# Patient Record
Sex: Male | Born: 1985 | Race: White | Hispanic: No | Marital: Single | Smoking: Former smoker
Health system: Southern US, Community
[De-identification: ages and names within clinical notes are randomized; demographics above are authoritative.]

## PROBLEM LIST (undated history)

## (undated) DIAGNOSIS — R519 Headache, unspecified: Secondary | ICD-10-CM

## (undated) DIAGNOSIS — Z789 Other specified health status: Secondary | ICD-10-CM

## (undated) DIAGNOSIS — K219 Gastro-esophageal reflux disease without esophagitis: Secondary | ICD-10-CM

## (undated) HISTORY — PX: KNEE SURGERY: SHX244

## (undated) HISTORY — PX: TONSILLECTOMY: SUR1361

---

## 2004-05-20 ENCOUNTER — Ambulatory Visit: Payer: Self-pay | Admitting: Unknown Physician Specialty

## 2017-01-26 ENCOUNTER — Ambulatory Visit
Admission: EM | Admit: 2017-01-26 | Discharge: 2017-01-26 | Disposition: A | Discharge status: Home / Self Care | Payer: Self-pay | Attending: Family Medicine | Admitting: Family Medicine

## 2017-01-26 ENCOUNTER — Other Ambulatory Visit: Payer: Self-pay

## 2017-01-26 ENCOUNTER — Encounter: Payer: Self-pay | Admitting: Emergency Medicine

## 2017-01-26 DIAGNOSIS — H1031 Unspecified acute conjunctivitis, right eye: Secondary | ICD-10-CM

## 2017-01-26 DIAGNOSIS — H109 Unspecified conjunctivitis: Secondary | ICD-10-CM

## 2017-01-26 DIAGNOSIS — H00011 Hordeolum externum right upper eyelid: Secondary | ICD-10-CM

## 2017-01-26 HISTORY — DX: Other specified health status: Z78.9

## 2017-01-26 MED ORDER — OLOPATADINE HCL 0.2 % OP SOLN: OPHTHALMIC | 0 refills | Status: DC

## 2017-01-26 MED ORDER — POLYMYXIN B-TRIMETHOPRIM 10000-0.1 UNIT/ML-% OP SOLN: 1.0000 [drp] | Freq: Four times a day (QID) | OPHTHALMIC | 0 refills | Status: AC

## 2017-01-26 NOTE — Discharge Instructions (Signed)
Eye drops as prescribed.  Warm compresses frequently.  Take care  Dr. Adriana Simasook

## 2017-01-26 NOTE — ED Provider Notes (Signed)
[facility]   CSN: [id number] Arrival date & time: 01/26/17  1125  History   Chief Complaint Chief Complaint  Patient presents with  . Conjunctivitis   HPI  32 year old male presents with the above complaint.  Patient reports a 1-2-week history of bilateral eye redness and watering.  Reports associated itching.  Recently, the right eye has improved but the left eye has worsened.  He reports he has had some crusting.  He is now developed eyelid edema of the left eye, particularly the upper lid.  He has been using over-the-counter eyedrops and Neosporin without resolution.  Vision intact.  He does report mild pain.  No known inciting factor.  No known exacerbating factors.  No other complaints at this time.  Past Medical History:  Diagnosis Date  . No known health problems    Past Surgical History:  Procedure Laterality Date  . KNEE SURGERY Right    Home Medications    Prior to Admission medications   Medication Sig Start Date End Date Taking? Authorizing Provider  Olopatadine HCl 0.2 % SOLN 1 drop to each eye daily. 01/26/17   Tommie Samsook, Aaden Buckman G, DO  trimethoprim-polymyxin b (POLYTRIM) ophthalmic solution Place 1 drop into both eyes every 6 (six) hours for 5 days. 01/26/17 01/31/17  Tommie Samsook, Kenson Groh G, DO   Family History Family History  Problem Relation Age of Onset  . Healthy Mother   . Healthy Father    Social History Social History   Tobacco Use  . Smoking status: Former Smoker    Last attempt to quit: 01/26/2005    Years since quitting: 12.0  . Smokeless tobacco: Never Used  Substance Use Topics  . Alcohol use: Yes    Alcohol/week: 1.8 oz    Types: 3 Cans of beer per week  . Drug use: No    Allergies   Patient has no known allergies.   Review of Systems Review of Systems  Constitutional: Negative for fever.  Eyes: Positive for discharge, redness and itching. Negative for photophobia and visual disturbance.   Physical Exam Triage Vital Signs ED Triage  Vitals [01/26/17 1149]  Enc Vitals Group     BP 130/74     Pulse Rate 60     Resp 16     Temp 97.8 F (36.6 C)     Temp Source Oral     SpO2 100 %     Weight 150 lb (68 kg)     Height 5\' 9"  (1.753 m)     Head Circumference      Peak Flow      Pain Score 0     Pain Loc      Pain Edu?      Excl. in GC?    Updated Vital Signs BP 130/74 (BP Location: Left Arm)   Pulse 60   Temp 97.8 F (36.6 C) (Oral)   Resp 16   Ht 5\' 9"  (1.753 m)   Wt 150 lb (68 kg)   SpO2 100%   BMI 22.15 kg/m   Visual Acuity Right Eye Distance: 20/25 Left Eye Distance: 20/25 Bilateral Distance: 20/20  Right Eye Near:   Left Eye Near:    Bilateral Near:     Physical Exam  Constitutional: He is oriented to person, place, and time. He appears well-developed and well-nourished. No distress.  HENT:  Head: Normocephalic and atraumatic.  Nose: Nose normal.  Eyes: EOM are normal.  Right eye -normal conjunctiva.  No drainage.  Left  eye -mild conjunctival injection.  Upper lid edema, diffuse.  Pulmonary/Chest: Effort normal. No respiratory distress.  Neurological: He is alert and oriented to person, place, and time.  Psychiatric: He has a normal mood and affect. His behavior is normal.  Nursing note and vitals reviewed.  UC Treatments / Results  Labs (all labs ordered are listed, but only abnormal results are displayed) Labs Reviewed - No data to display  EKG  EKG Interpretation None       Radiology No results found.  Procedures Procedures (including critical care time)  Medications Ordered in UC Medications - No data to display   Initial Impression / Assessment and Plan / UC Course  I have reviewed the triage vital signs and the nursing notes.  Pertinent labs & imaging results that were available during my care of the patient were reviewed by me and considered in my medical decision making (see chart for details).     32 year old male presents with ongoing conjunctivitis.  Also  with eyelid edema concerning for hordeolum.  Treating with Polytrim and Pataday.  Final Clinical Impressions(s) / UC Diagnoses   Final diagnoses:  Conjunctivitis of right eye, unspecified conjunctivitis type  Hordeolum externum of right upper eyelid    ED Discharge Orders        Ordered    trimethoprim-polymyxin b (POLYTRIM) ophthalmic solution  Every 6 hours     01/26/17 1202    Olopatadine HCl 0.2 % SOLN     01/26/17 1202     Controlled Substance Prescriptions [place] Controlled Substance Registry consulted? Not Applicable   Tommie Sams, DO 01/26/17 1212

## 2017-01-26 NOTE — ED Triage Notes (Signed)
Patient in today c/o 1-2 week history of conjunctivitis. Patient has been using OTC stye ointment, "pink eye drops" from [facility] and Neosporin to left eye lid.

## 2017-01-29 ENCOUNTER — Telehealth: Payer: Self-pay | Admitting: Emergency Medicine

## 2017-01-29 NOTE — Telephone Encounter (Signed)
Tried to call patient to follow up from his visit on 01/26/17. Phone number listed is not in service.

## 2019-11-19 ENCOUNTER — Emergency Department
Admission: EM | Admit: 2019-11-19 | Discharge: 2019-11-19 | Disposition: A | Discharge status: Home / Self Care | Payer: BC Managed Care – PPO | Attending: Emergency Medicine | Admitting: Emergency Medicine

## 2019-11-19 ENCOUNTER — Emergency Department: Payer: BC Managed Care – PPO

## 2019-11-19 ENCOUNTER — Other Ambulatory Visit: Payer: Self-pay

## 2019-11-19 ENCOUNTER — Encounter: Payer: Self-pay | Admitting: Emergency Medicine

## 2019-11-19 DIAGNOSIS — W010XXA Fall on same level from slipping, tripping and stumbling without subsequent striking against object, initial encounter: Secondary | ICD-10-CM | POA: Diagnosis not present

## 2019-11-19 DIAGNOSIS — Y9353 Activity, golf: Secondary | ICD-10-CM | POA: Insufficient documentation

## 2019-11-19 DIAGNOSIS — S8261XA Displaced fracture of lateral malleolus of right fibula, initial encounter for closed fracture: Secondary | ICD-10-CM | POA: Diagnosis not present

## 2019-11-19 DIAGNOSIS — Z87891 Personal history of nicotine dependence: Secondary | ICD-10-CM | POA: Insufficient documentation

## 2019-11-19 DIAGNOSIS — S82831A Other fracture of upper and lower end of right fibula, initial encounter for closed fracture: Secondary | ICD-10-CM

## 2019-11-19 DIAGNOSIS — S99911A Unspecified injury of right ankle, initial encounter: Secondary | ICD-10-CM | POA: Diagnosis present

## 2019-11-19 MED ORDER — ONDANSETRON 4 MG PO TBDP: 4.0000 mg | ORAL_TABLET | Freq: Three times a day (TID) | ORAL | 0 refills | Status: AC | PRN

## 2019-11-19 MED ORDER — HYDROCODONE-ACETAMINOPHEN 5-325 MG PO TABS: 1.0000 | ORAL_TABLET | Freq: Four times a day (QID) | ORAL | 0 refills | Status: AC | PRN

## 2019-11-19 NOTE — Discharge Instructions (Signed)
Please make follow up appointment with ortho.  You can take Norco for pain.

## 2019-11-19 NOTE — ED Triage Notes (Signed)
Pt comes into the ED via POV c/o right ankle pain after slipping on the tee while golfing.  Pt in NAD.

## 2019-11-19 NOTE — ED Provider Notes (Signed)
Emergency Department Provider Note  ____________________________________________  Time seen: Approximately 7:43 PM  I have reviewed the triage vital signs and the nursing notes.   HISTORY  Chief Complaint Ankle Pain   Historian Patient    HPI Brian Delgado is a 34 y.o. male presents to the emergency department with acute right ankle pain after slipping on a tree while golfing.  Patient did not hit his head or his neck.  No numbness or tingling of the lower extremities.  Patient has had difficulty bearing weight since injury occurred.  He states that he has had prior right ankle sprains in the past.  No other alleviating measures of been attempted.   Past Medical History:  Diagnosis Date   No known health problems      Immunizations up to date:  Yes.     Past Medical History:  Diagnosis Date   No known health problems     There are no problems to display for this patient.   Past Surgical History:  Procedure Laterality Date   KNEE SURGERY Right     Prior to Admission medications   Medication Sig Start Date End Date Taking? Authorizing Provider  HYDROcodone-acetaminophen (NORCO/VICODIN) 5-325 MG tablet Take 1 tablet by mouth every 6 (six) hours as needed for up to 3 days for moderate pain. 11/19/19 11/22/19  Orvil Feil, PA-C  Olopatadine HCl 0.2 % SOLN 1 drop to each eye daily. 01/26/17   Tommie Sams, DO  ondansetron (ZOFRAN ODT) 4 MG disintegrating tablet Take 1 tablet (4 mg total) by mouth every 8 (eight) hours as needed for up to 5 days. 11/19/19 11/24/19  Orvil Feil, PA-C    Allergies Patient has no known allergies.  Family History  Problem Relation Age of Onset   Healthy Mother    Healthy Father     Social History Social History   Tobacco Use   Smoking status: Former Smoker    Quit date: 01/26/2005    Years since quitting: 14.8   Smokeless tobacco: Never Used  Vaping Use   Vaping Use: Never used  Substance Use Topics   Alcohol  use: Yes    Alcohol/week: 3.0 standard drinks    Types: 3 Cans of beer per week   Drug use: No     Review of Systems  Constitutional: No fever/chills Eyes:  No discharge ENT: No upper respiratory complaints. Respiratory: no cough. No SOB/ use of accessory muscles to breath Gastrointestinal:   No nausea, no vomiting.  No diarrhea.  No constipation. Musculoskeletal: Patient has right ankle pain.  Skin: Negative for rash, abrasions, lacerations, ecchymosis.    ____________________________________________   PHYSICAL EXAM:  VITAL SIGNS: ED Triage Vitals  Enc Vitals Group     BP 11/19/19 1757 (!) 170/103     Pulse Rate 11/19/19 1757 75     Resp 11/19/19 1757 18     Temp 11/19/19 1757 97.8 F (36.6 C)     Temp Source 11/19/19 1757 Oral     SpO2 11/19/19 1757 99 %     Weight 11/19/19 1759 160 lb (72.6 kg)     Height 11/19/19 1759 5\' 10"  (1.778 m)     Head Circumference --      Peak Flow --      Pain Score 11/19/19 1759 8     Pain Loc --      Pain Edu? --      Excl. in GC? --  Constitutional: Alert and oriented. Well appearing and in no acute distress. Eyes: Conjunctivae are normal. PERRL. EOMI. Head: Atraumatic. Cardiovascular: Normal rate, regular rhythm. Normal S1 and S2.  Good peripheral circulation. Respiratory: Normal respiratory effort without tachypnea or retractions. Lungs CTAB. Good air entry to the bases with no decreased or absent breath sounds Gastrointestinal: Bowel sounds x 4 quadrants. Soft and nontender to palpation. No guarding or rigidity. No distention. Musculoskeletal: Patient performs limited range of motion at the right ankle.  He has tenderness to palpation over the lateral malleolus.  He is able to move all five right toes.  Palpable dorsalis pedis pulse bilaterally and symmetrically.  Capillary refill less than 2 seconds on the right. Neurologic:  Normal for age. No gross focal neurologic deficits are appreciated.  Skin:  Skin is warm, dry and  intact. No rash noted. Psychiatric: Mood and affect are normal for age. Speech and behavior are normal.   ____________________________________________   LABS (all labs ordered are listed, but only abnormal results are displayed)  Labs Reviewed - No data to display ____________________________________________  EKG   ____________________________________________  RADIOLOGY Geraldo Pitter, personally viewed and evaluated these images (plain radiographs) as part of my medical decision making, as well as reviewing the written report by the radiologist.  DG Ankle Complete Right  Result Date: 11/19/2019 CLINICAL DATA:  Right ankle pain after slipping on tee while golfing. EXAM: RIGHT ANKLE - COMPLETE 3+ VIEW COMPARISON:  None. FINDINGS: Transverse/oblique fracture of the lateral malleolus is minimally displaced at the level of the ankle mortise. No distal tibial fracture. There is no mortise widening. Small ankle joint effusion. There is lateral soft tissue edema. IMPRESSION: Minimally displaced lateral malleolar fracture. Associated soft tissue edema and small joint effusion. Electronically Signed   By: Narda Rutherford M.D.   On: 11/19/2019 18:35    ____________________________________________    PROCEDURES  Procedure(s) performed:     Procedures     Medications - No data to display   ____________________________________________   INITIAL IMPRESSION / ASSESSMENT AND PLAN / ED COURSE  Pertinent labs & imaging results that were available during my care of the patient were reviewed by me and considered in my medical decision making (see chart for details).      Assessment and plan Right ankle pain 34 year old male presents to the emergency department with acute right ankle pain after slipping on a tea while golfing.  X-rays of the right ankle were visualized and patient has a mildly displaced right lateral malleolar fracture.  Patient was placed in a cam boot and was  discharged with a short course of Norco.  Crutches were provided.  He was advised to follow-up with orthopedics, Dr. Odis Luster.  All patient questions were answered.    ____________________________________________  FINAL CLINICAL IMPRESSION(S) / ED DIAGNOSES  Final diagnoses:  Closed fracture of distal end of right fibula, unspecified fracture morphology, initial encounter      NEW MEDICATIONS STARTED DURING THIS VISIT:  ED Discharge Orders         Ordered    HYDROcodone-acetaminophen (NORCO/VICODIN) 5-325 MG tablet  Every 6 hours PRN        11/19/19 1939    ondansetron (ZOFRAN ODT) 4 MG disintegrating tablet  Every 8 hours PRN        11/19/19 1939              This chart was dictated using voice recognition software/Dragon. Despite best efforts to proofread, errors can occur which  can change the meaning. Any change was purely unintentional.     Orvil Feil, PA-C 11/19/19 2208    Gilles Chiquito, MD 11/19/19 2219

## 2019-11-25 ENCOUNTER — Other Ambulatory Visit: Payer: Self-pay | Admitting: Podiatry

## 2019-11-26 ENCOUNTER — Other Ambulatory Visit: Payer: Self-pay

## 2019-11-26 ENCOUNTER — Encounter
Admission: RE | Admit: 2019-11-26 | Discharge: 2019-11-26 | Disposition: A | Discharge status: Home / Self Care | Payer: BC Managed Care – PPO | Source: Ambulatory Visit | Attending: Podiatry | Admitting: Podiatry

## 2019-11-26 DIAGNOSIS — Z01812 Encounter for preprocedural laboratory examination: Secondary | ICD-10-CM | POA: Insufficient documentation

## 2019-11-26 DIAGNOSIS — Z20822 Contact with and (suspected) exposure to covid-19: Secondary | ICD-10-CM | POA: Diagnosis not present

## 2019-11-26 DIAGNOSIS — S82831A Other fracture of upper and lower end of right fibula, initial encounter for closed fracture: Secondary | ICD-10-CM | POA: Diagnosis present

## 2019-11-26 DIAGNOSIS — X58XXXA Exposure to other specified factors, initial encounter: Secondary | ICD-10-CM | POA: Diagnosis not present

## 2019-11-26 DIAGNOSIS — K219 Gastro-esophageal reflux disease without esophagitis: Secondary | ICD-10-CM | POA: Diagnosis not present

## 2019-11-26 DIAGNOSIS — Z87891 Personal history of nicotine dependence: Secondary | ICD-10-CM | POA: Diagnosis not present

## 2019-11-26 DIAGNOSIS — Z803 Family history of malignant neoplasm of breast: Secondary | ICD-10-CM | POA: Diagnosis not present

## 2019-11-26 DIAGNOSIS — Y939 Activity, unspecified: Secondary | ICD-10-CM | POA: Diagnosis not present

## 2019-11-26 DIAGNOSIS — Z8249 Family history of ischemic heart disease and other diseases of the circulatory system: Secondary | ICD-10-CM | POA: Diagnosis not present

## 2019-11-26 DIAGNOSIS — Z820 Family history of epilepsy and other diseases of the nervous system: Secondary | ICD-10-CM | POA: Diagnosis not present

## 2019-11-26 HISTORY — DX: Gastro-esophageal reflux disease without esophagitis: K21.9

## 2019-11-26 HISTORY — DX: Headache, unspecified: R51.9

## 2019-11-26 NOTE — Patient Instructions (Signed)
Your procedure is scheduled on: Friday 11/28/19.  Report to THE FIRST FLOOR REGISTRATION DESK IN THE MEDICAL MALL ON THE MORNING OF SURGERY FIRST, THEN YOU WILL CHECK IN AT THE SURGERY INFORMATION DESK LOCATED OUTSIDE THE SAME DAY SURGERY DEPARTMENT LOCATED ON 2ND FLOOR MEDICAL MALL ENTRANCE. To find out your arrival time please call [phone] between 1PM - 3PM on Thursday 11/27/19.   Remember: Instructions that are not followed completely may result in serious medical risk, up to and including death, or upon the discretion of your surgeon and anesthesiologist your surgery may need to be rescheduled.     __X__ 1. Do not eat food after midnight the night before your procedure.                 No gum chewing or hard candies. You may drink clear liquids up to 2 hours                 before you are scheduled to arrive for your surgery- DO NOT drink clear                 liquids within 2 hours of the start of your surgery.                 Clear Liquids include:  water, apple juice without pulp, clear carbohydrate                 drink such as Clearfast or Gatorade, Black Coffee or Tea (Do not add                 milk or creamer to coffee or tea).  __X__2.  On the morning of surgery brush your teeth with toothpaste and water, you may rinse your mouth with mouthwash if you wish.  Do not swallow any toothpaste or mouthwash.    __X__ 3.  No Alcohol for 24 hours before or after surgery.  __X__ 4.  Do Not Smoke or use e-cigarettes For 24 Hours Prior to Your Surgery.                 Do not use any chewable tobacco products for at least 6 hours prior to                 surgery.  __X__5.  Notify your doctor if there is any change in your medical condition      (cold, fever, infections).      Do NOT wear jewelry, make-up, hairpins, clips or nail polish. Do NOT wear lotions, powders, or perfumes.  Do NOT shave 48 hours prior to surgery. Men may shave face and neck. Do NOT bring valuables to the  hospital.     [facility] is not responsible for any belongings or valuables.   Contacts, dentures/partials or body piercings may not be worn into surgery. Bring a case for your contacts, glasses or hearing aids, a denture cup will be supplied.    Patients discharged the day of surgery will not be allowed to drive home.     __X__ Take these medicines the morning of surgery with A SIP OF WATER:     1. acetaminophen (TYLENOL) if needed    __X__ Use CHG SAGE wipes as directed.  __X__ Stop Anti-inflammatories 7 days before surgery such as Advil, Ibuprofen, Motrin, BC or Goodies Powder, Naprosyn, Naproxen, Aleve, Aspirin, Meloxicam. May take Tylenol if needed for pain or discomfort.   __X__Do not start taking any new herbal supplements  or vitamins prior to your procedure.    Wear comfortable clothing (specific to your surgery type) to the hospital.  Plan for stool softeners for home use; pain medications have a tendency to cause constipation. You can also help prevent constipation by eating foods high in fiber such as fruits and vegetables and drinking plenty of fluids as your diet allows.  After surgery, you can prevent lung complications by doing breathing exercises.Take deep breaths and cough every 1-2 hours. Your doctor may order a device called an Incentive Spirometer to help you take deep breaths.  Please call the Pre-Admissions Testing Department at [phone] if you have any questions about these instructions.

## 2019-11-27 ENCOUNTER — Other Ambulatory Visit
Admission: RE | Admit: 2019-11-27 | Discharge: 2019-11-27 | Disposition: A | Discharge status: Home / Self Care | Payer: BC Managed Care – PPO | Source: Ambulatory Visit | Attending: Podiatry | Admitting: Podiatry

## 2019-11-27 DIAGNOSIS — S82831A Other fracture of upper and lower end of right fibula, initial encounter for closed fracture: Secondary | ICD-10-CM | POA: Diagnosis not present

## 2019-11-27 DIAGNOSIS — Z01812 Encounter for preprocedural laboratory examination: Secondary | ICD-10-CM | POA: Insufficient documentation

## 2019-11-27 DIAGNOSIS — Z20822 Contact with and (suspected) exposure to covid-19: Secondary | ICD-10-CM | POA: Insufficient documentation

## 2019-11-28 ENCOUNTER — Encounter: Payer: Self-pay | Admitting: Podiatry

## 2019-11-28 ENCOUNTER — Ambulatory Visit: Payer: BC Managed Care – PPO

## 2019-11-28 ENCOUNTER — Ambulatory Visit: Payer: BC Managed Care – PPO | Admitting: Anesthesiology

## 2019-11-28 ENCOUNTER — Other Ambulatory Visit: Payer: Self-pay

## 2019-11-28 ENCOUNTER — Encounter
Admission: RE | Disposition: A | Discharge status: Home / Self Care | Payer: Self-pay | Source: Home / Self Care | Attending: Podiatry

## 2019-11-28 ENCOUNTER — Ambulatory Visit
Admission: RE | Admit: 2019-11-28 | Discharge: 2019-11-28 | Disposition: A | Discharge status: Home / Self Care | Payer: BC Managed Care – PPO | Attending: Podiatry | Admitting: Podiatry

## 2019-11-28 DIAGNOSIS — Y939 Activity, unspecified: Secondary | ICD-10-CM | POA: Insufficient documentation

## 2019-11-28 DIAGNOSIS — Z20822 Contact with and (suspected) exposure to covid-19: Secondary | ICD-10-CM | POA: Insufficient documentation

## 2019-11-28 DIAGNOSIS — X58XXXA Exposure to other specified factors, initial encounter: Secondary | ICD-10-CM | POA: Insufficient documentation

## 2019-11-28 DIAGNOSIS — Z419 Encounter for procedure for purposes other than remedying health state, unspecified: Secondary | ICD-10-CM

## 2019-11-28 DIAGNOSIS — K219 Gastro-esophageal reflux disease without esophagitis: Secondary | ICD-10-CM | POA: Insufficient documentation

## 2019-11-28 DIAGNOSIS — Z8249 Family history of ischemic heart disease and other diseases of the circulatory system: Secondary | ICD-10-CM | POA: Insufficient documentation

## 2019-11-28 DIAGNOSIS — Z820 Family history of epilepsy and other diseases of the nervous system: Secondary | ICD-10-CM | POA: Insufficient documentation

## 2019-11-28 DIAGNOSIS — S82831A Other fracture of upper and lower end of right fibula, initial encounter for closed fracture: Secondary | ICD-10-CM | POA: Diagnosis not present

## 2019-11-28 DIAGNOSIS — Z87891 Personal history of nicotine dependence: Secondary | ICD-10-CM | POA: Insufficient documentation

## 2019-11-28 DIAGNOSIS — Z803 Family history of malignant neoplasm of breast: Secondary | ICD-10-CM | POA: Insufficient documentation

## 2019-11-28 HISTORY — PX: ORIF ANKLE FRACTURE: SHX5408

## 2019-11-28 LAB — SARS CORONAVIRUS 2 (TAT 6-24 HRS): SARS Coronavirus 2: NEGATIVE

## 2019-11-28 SURGERY — OPEN REDUCTION INTERNAL FIXATION (ORIF) ANKLE FRACTURE
Anesthesia: General | Site: Ankle | Laterality: Right

## 2019-11-28 MED ORDER — ONDANSETRON HCL 4 MG PO TABS: 4.0000 mg | ORAL_TABLET | Freq: Four times a day (QID) | ORAL | Status: DC | PRN

## 2019-11-28 MED ORDER — FENTANYL CITRATE (PF) 100 MCG/2ML IJ SOLN
INTRAMUSCULAR | Status: AC
  Filled 2019-11-28: qty 2

## 2019-11-28 MED ORDER — FAMOTIDINE 20 MG PO TABS
ORAL_TABLET | ORAL | Status: AC
  Administered 2019-11-28: 20 mg via ORAL
  Filled 2019-11-28: qty 1

## 2019-11-28 MED ORDER — POVIDONE-IODINE 10 % EX SWAB
2.0000 "application " | Freq: Once | CUTANEOUS | Status: AC
  Administered 2019-11-28: 2 via TOPICAL

## 2019-11-28 MED ORDER — MIDAZOLAM HCL 2 MG/2ML IJ SOLN
INTRAMUSCULAR | Status: AC
  Administered 2019-11-28: 1 mg via INTRAVENOUS
  Filled 2019-11-28: qty 2

## 2019-11-28 MED ORDER — DEXAMETHASONE SODIUM PHOSPHATE 10 MG/ML IJ SOLN
INTRAMUSCULAR | Status: DC | PRN
  Administered 2019-11-28: 10 mg via INTRAVENOUS

## 2019-11-28 MED ORDER — LACTATED RINGERS IV SOLN: INTRAVENOUS | Status: DC

## 2019-11-28 MED ORDER — LIDOCAINE HCL (PF) 1 % IJ SOLN
INTRAMUSCULAR | Status: AC
  Filled 2019-11-28: qty 5

## 2019-11-28 MED ORDER — ROPIVACAINE HCL 5 MG/ML IJ SOLN
INTRAMUSCULAR | Status: DC | PRN
  Administered 2019-11-28: 30 mL via PERINEURAL

## 2019-11-28 MED ORDER — PROPOFOL 10 MG/ML IV BOLUS
INTRAVENOUS | Status: DC | PRN
  Administered 2019-11-28: 200 mg via INTRAVENOUS
  Administered 2019-11-28: 150 mg via INTRAVENOUS

## 2019-11-28 MED ORDER — ROPIVACAINE HCL 5 MG/ML IJ SOLN
INTRAMUSCULAR | Status: AC
  Filled 2019-11-28: qty 30

## 2019-11-28 MED ORDER — FENTANYL CITRATE (PF) 100 MCG/2ML IJ SOLN: 50.0000 ug | Freq: Once | INTRAMUSCULAR | Status: AC

## 2019-11-28 MED ORDER — MIDAZOLAM HCL 2 MG/2ML IJ SOLN
INTRAMUSCULAR | Status: AC
  Filled 2019-11-28: qty 2

## 2019-11-28 MED ORDER — FAMOTIDINE 20 MG PO TABS: 20.0000 mg | ORAL_TABLET | Freq: Once | ORAL | Status: AC

## 2019-11-28 MED ORDER — BUPIVACAINE HCL (PF) 0.25 % IJ SOLN
INTRAMUSCULAR | Status: DC | PRN
  Administered 2019-11-28: 20 mL

## 2019-11-28 MED ORDER — CHLORHEXIDINE GLUCONATE 0.12 % MT SOLN
OROMUCOSAL | Status: AC
  Administered 2019-11-28: 15 mL via OROMUCOSAL
  Filled 2019-11-28: qty 15

## 2019-11-28 MED ORDER — MIDAZOLAM HCL 2 MG/2ML IJ SOLN: 1.0000 mg | Freq: Once | INTRAMUSCULAR | Status: AC

## 2019-11-28 MED ORDER — ONDANSETRON HCL 4 MG/2ML IJ SOLN
INTRAMUSCULAR | Status: DC | PRN
  Administered 2019-11-28: 4 mg via INTRAVENOUS

## 2019-11-28 MED ORDER — FENTANYL CITRATE (PF) 100 MCG/2ML IJ SOLN
INTRAMUSCULAR | Status: AC
  Administered 2019-11-28: 50 ug via INTRAVENOUS
  Filled 2019-11-28: qty 2

## 2019-11-28 MED ORDER — LIDOCAINE HCL (CARDIAC) PF 100 MG/5ML IV SOSY
PREFILLED_SYRINGE | INTRAVENOUS | Status: DC | PRN
  Administered 2019-11-28: 100 mg via INTRAVENOUS

## 2019-11-28 MED ORDER — MIDAZOLAM HCL 2 MG/2ML IJ SOLN
0.5000 mg | Freq: Once | INTRAMUSCULAR | Status: AC
  Administered 2019-11-28: 0.5 mg via INTRAVENOUS

## 2019-11-28 MED ORDER — OXYCODONE-ACETAMINOPHEN 5-325 MG PO TABS
1.0000 | ORAL_TABLET | ORAL | 0 refills | Status: AC | PRN
Start: 2019-11-28 — End: 2020-11-27

## 2019-11-28 MED ORDER — CEFAZOLIN SODIUM-DEXTROSE 2-4 GM/100ML-% IV SOLN
INTRAVENOUS | Status: AC
  Filled 2019-11-28: qty 100

## 2019-11-28 MED ORDER — FENTANYL CITRATE (PF) 100 MCG/2ML IJ SOLN
INTRAMUSCULAR | Status: DC | PRN
  Administered 2019-11-28: 50 ug via INTRAVENOUS

## 2019-11-28 MED ORDER — CEFAZOLIN SODIUM-DEXTROSE 2-4 GM/100ML-% IV SOLN
2.0000 g | INTRAVENOUS | Status: AC
  Administered 2019-11-28: 2 g via INTRAVENOUS

## 2019-11-28 MED ORDER — LIDOCAINE HCL (PF) 1 % IJ SOLN
INTRAMUSCULAR | Status: DC | PRN
  Administered 2019-11-28: 5 mL via SUBCUTANEOUS

## 2019-11-28 MED ORDER — FENTANYL CITRATE (PF) 100 MCG/2ML IJ SOLN: 25.0000 ug | INTRAMUSCULAR | Status: DC | PRN

## 2019-11-28 MED ORDER — ONDANSETRON HCL 4 MG/2ML IJ SOLN: 4.0000 mg | Freq: Four times a day (QID) | INTRAMUSCULAR | Status: DC | PRN

## 2019-11-28 MED ORDER — PROPOFOL 10 MG/ML IV BOLUS
INTRAVENOUS | Status: AC
  Filled 2019-11-28: qty 20

## 2019-11-28 MED ORDER — CHLORHEXIDINE GLUCONATE 0.12 % MT SOLN: 15.0000 mL | Freq: Once | OROMUCOSAL | Status: AC

## 2019-11-28 MED ORDER — PROMETHAZINE HCL 25 MG/ML IJ SOLN: 6.2500 mg | INTRAMUSCULAR | Status: DC | PRN

## 2019-11-28 MED ORDER — ORAL CARE MOUTH RINSE: 15.0000 mL | Freq: Once | OROMUCOSAL | Status: AC

## 2019-11-28 SURGICAL SUPPLY — 56 items
BIT DRILL 2.5X2.75 QC CALB (BIT) ×1 IMPLANT
BIT DRILL CALIBRATED 2.7 (BIT) ×1 IMPLANT
BLADE SURG 15 STRL LF DISP TIS (BLADE) IMPLANT
BLADE SURG 15 STRL SS (BLADE)
BNDG CMPR STD VLCR NS LF 5.8X4 (GAUZE/BANDAGES/DRESSINGS) ×1
BNDG COHESIVE 4X5 TAN STRL (GAUZE/BANDAGES/DRESSINGS) ×2 IMPLANT
BNDG CONFORM 2 STRL LF (GAUZE/BANDAGES/DRESSINGS) ×2 IMPLANT
BNDG CONFORM 3 STRL LF (GAUZE/BANDAGES/DRESSINGS) ×2 IMPLANT
BNDG ELASTIC 4X5.8 VLCR NS LF (GAUZE/BANDAGES/DRESSINGS) ×2 IMPLANT
BNDG ESMARK 4X12 TAN STRL LF (GAUZE/BANDAGES/DRESSINGS) ×2 IMPLANT
BNDG GAUZE 4.5X4.1 6PLY STRL (MISCELLANEOUS) ×2 IMPLANT
BOOT STEPPER DURA MED (SOFTGOODS) ×1 IMPLANT
CANISTER SUCT 1200ML W/VALVE (MISCELLANEOUS) ×2 IMPLANT
COVER WAND RF STERILE (DRAPES) ×2 IMPLANT
CUFF TOURN SGL QUICK 18X4 (TOURNIQUET CUFF) IMPLANT
CUFF TOURN SGL QUICK 24 (TOURNIQUET CUFF)
CUFF TRNQT CYL 24X4X16.5-23 (TOURNIQUET CUFF) IMPLANT
DRAPE C-ARM XRAY 36X54 (DRAPES) ×2 IMPLANT
DRAPE C-ARMOR (DRAPES) ×2 IMPLANT
DURAPREP 26ML APPLICATOR (WOUND CARE) ×2 IMPLANT
ELECT REM PT RETURN 9FT ADLT (ELECTROSURGICAL) ×2
ELECTRODE REM PT RTRN 9FT ADLT (ELECTROSURGICAL) ×1 IMPLANT
GAUZE SPONGE 4X4 12PLY STRL (GAUZE/BANDAGES/DRESSINGS) ×2 IMPLANT
GAUZE XEROFORM 1X8 LF (GAUZE/BANDAGES/DRESSINGS) ×2 IMPLANT
GLOVE BIO SURGEON STRL SZ7.5 (GLOVE) ×2 IMPLANT
GLOVE INDICATOR 8.0 STRL GRN (GLOVE) ×2 IMPLANT
GOWN STRL REUS W/ TWL XL LVL3 (GOWN DISPOSABLE) ×3 IMPLANT
GOWN STRL REUS W/TWL XL LVL3 (GOWN DISPOSABLE) ×6
K-WIRE ACE 1.6X6 (WIRE) ×2
KIT TURNOVER KIT A (KITS) ×2 IMPLANT
KWIRE ACE 1.6X6 (WIRE) IMPLANT
LABEL OR SOLS (LABEL) ×2 IMPLANT
MANIFOLD NEPTUNE II (INSTRUMENTS) ×2 IMPLANT
NEEDLE HYPO 22GX1.5 SAFETY (NEEDLE) ×2 IMPLANT
NS IRRIG 500ML POUR BTL (IV SOLUTION) ×2 IMPLANT
PACK EXTREMITY (MISCELLANEOUS) ×2 IMPLANT
PAD PREP 24X41 OB/GYN DISP (PERSONAL CARE ITEMS) ×2 IMPLANT
PLATE LOCK 6H 77 BILAT FIB (Plate) ×1 IMPLANT
SCREW LOCK CORT STAR 3.5X10 (Screw) ×2 IMPLANT
SCREW LOCK CORT STAR 3.5X12 (Screw) ×4 IMPLANT
SPLINT CAST 1 STEP 4X30 (MISCELLANEOUS) ×2 IMPLANT
SPLINT FAST PLASTER 5X30 (CAST SUPPLIES) ×1
SPLINT PLASTER CAST FAST 5X30 (CAST SUPPLIES) ×1 IMPLANT
SPONGE LAP 18X18 RF (DISPOSABLE) ×2 IMPLANT
STAPLER SKIN PROX 35W (STAPLE) ×2 IMPLANT
STOCKINETTE M/LG 89821 (MISCELLANEOUS) ×2 IMPLANT
STRAP SAFETY 5IN WIDE (MISCELLANEOUS) ×2 IMPLANT
STRIP CLOSURE SKIN 1/2X4 (GAUZE/BANDAGES/DRESSINGS) IMPLANT
SUT PDS AB 2-0 CT1 27 (SUTURE) ×1 IMPLANT
SUT VIC AB 2-0 CT1 27 (SUTURE) ×2
SUT VIC AB 2-0 CT1 TAPERPNT 27 (SUTURE) ×1 IMPLANT
SUT VIC AB 3-0 SH 27 (SUTURE) ×2
SUT VIC AB 3-0 SH 27X BRD (SUTURE) ×1 IMPLANT
SWABSTK COMLB BENZOIN TINCTURE (MISCELLANEOUS) IMPLANT
SYR 10ML LL (SYRINGE) ×2 IMPLANT
SYR 50ML LL SCALE MARK (SYRINGE) ×2 IMPLANT

## 2019-11-28 NOTE — Anesthesia Procedure Notes (Signed)
Anesthesia Regional Block: Popliteal block   Pre-Anesthetic Checklist: ,, timeout performed, Correct Patient, Correct Site, Correct Laterality, Correct Procedure, Correct Position, site marked, Risks and benefits discussed,  Surgical consent,  Pre-op evaluation,  At surgeon's request and post-op pain management  Laterality: Lower and Right  Prep: chloraprep       Needles:  Injection technique: Single-shot  Needle Type: Echogenic Needle     Needle Length: 9cm  Needle Gauge: 21     Additional Needles:   Procedures:,,,, ultrasound used (permanent image in chart),,,,  Narrative:  Start time: 11/28/2019 12:54 PM End time: 11/28/2019 12:57 PM Injection made incrementally with aspirations every 5 mL.  Performed by: Personally  Anesthesiologist: Lenard Simmer, MD  Additional Notes: Patient consented for risk and benefits of nerve block including but not limited to nerve damage, failed block, bleeding and infection.  Patient voiced understanding.  Functioning IV was confirmed and monitors were applied.  Timeout done prior to procedure and prior to any sedation being given to the patient.  Patient confirmed procedure site prior to any sedation given to the patient.  A 39mm 22ga Stimuplex needle was used. Sterile prep,hand hygiene and sterile gloves were used.  Minimal sedation used for procedure.  No paresthesia endorsed by patient during the procedure.  Negative aspiration and negative test dose prior to incremental administration of local anesthetic. The patient tolerated the procedure well with no immediate complications.

## 2019-11-28 NOTE — Anesthesia Preprocedure Evaluation (Signed)
Anesthesia Evaluation  Patient identified by MRN, date of birth, ID band Patient awake    Reviewed: Allergy & Precautions, H&P , NPO status , Patient's Chart, lab work & pertinent test results, reviewed documented beta blocker date and time   History of Anesthesia Complications Negative for: history of anesthetic complications  Airway Mallampati: I  TM Distance: >3 FB Neck ROM: full    Dental  (+) Dental Advidsory Given, Teeth Intact   Pulmonary neg pulmonary ROS, former smoker,    Pulmonary exam normal breath sounds clear to auscultation       Cardiovascular Exercise Tolerance: Good negative cardio ROS Normal cardiovascular exam Rhythm:regular Rate:Normal     Neuro/Psych negative neurological ROS  negative psych ROS   GI/Hepatic Neg liver ROS, GERD  ,  Endo/Other  negative endocrine ROS  Renal/GU negative Renal ROS  negative genitourinary   Musculoskeletal   Abdominal   Peds  Hematology negative hematology ROS (+)   Anesthesia Other Findings Past Medical History: No date: GERD (gastroesophageal reflux disease) No date: Headache No date: No known health problems   Reproductive/Obstetrics negative OB ROS                             Anesthesia Physical Anesthesia Plan  ASA: II  Anesthesia Plan: General   Post-op Pain Management:  Regional for Post-op pain   Induction: Intravenous  PONV Risk Score and Plan: 2 and Ondansetron, Dexamethasone, Midazolam, Promethazine and Treatment may vary due to age or medical condition  Airway Management Planned: LMA and Oral ETT  Additional Equipment:   Intra-op Plan:   Post-operative Plan: Extubation in OR  Informed Consent: I have reviewed the patients History and Physical, chart, labs and discussed the procedure including the risks, benefits and alternatives for the proposed anesthesia with the patient or authorized representative who  has indicated his/her understanding and acceptance.     Dental Advisory Given  Plan Discussed with: Anesthesiologist, CRNA and Surgeon  Anesthesia Plan Comments:         Anesthesia Quick Evaluation

## 2019-11-28 NOTE — Anesthesia Procedure Notes (Signed)
Procedure Name: LMA Insertion Date/Time: 11/28/2019 1:51 PM Performed by: Henrietta Hoover, CRNA Pre-anesthesia Checklist: Patient identified, Patient being monitored, Timeout performed, Emergency Drugs available and Suction available Patient Re-evaluated:Patient Re-evaluated prior to induction Oxygen Delivery Method: Circle system utilized Preoxygenation: Pre-oxygenation with 100% oxygen Induction Type: IV induction Ventilation: Mask ventilation without difficulty LMA: LMA inserted LMA Size: 4.0 Tube type: Oral Number of attempts: 1 Placement Confirmation: positive ETCO2 and breath sounds checked- equal and bilateral Tube secured with: Tape Dental Injury: Teeth and Oropharynx as per pre-operative assessment

## 2019-11-28 NOTE — Transfer of Care (Signed)
Immediate Anesthesia Transfer of Care Note  Patient: Brian Delgado  Procedure(s) Performed: OPEN REDUCTION INTERNAL FIXATION (ORIF) ANKLE FRACTURE (Right Ankle)  Patient Location: PACU  Anesthesia Type:General  Level of Consciousness: awake, alert  and oriented  Airway & Oxygen Therapy: Patient Spontanous Breathing  Post-op Assessment: Report given to RN and Post -op Vital signs reviewed and stable  Post vital signs: Reviewed and stable  Last Vitals:  Vitals Value Taken Time  BP 120/70 11/28/19 1508  Temp    Pulse 67 11/28/19 1513  Resp 16 11/28/19 1513  SpO2 100 % 11/28/19 1513  Vitals shown include unvalidated device data.  Last Pain:  Vitals:   11/28/19 1131  TempSrc: Temporal  PainSc: 5          Complications: No complications documented.

## 2019-11-28 NOTE — H&P (Addendum)
HISTORY AND PHYSICAL INTERVAL NOTE:  11/28/2019  12:02 PM  Brian Delgado  has presented today for surgery, with the diagnosis of S82.831 CLOSED FRACTURE DISTAL END RIGHT FIBULA and deltoid ligament tear.  The various methods of treatment have been discussed with the patient.  No guarantees were given.  After consideration of risks, benefits and other options for treatment, the patient has consented to surgery.  I have reviewed the patients' chart and labs.     A history and physical examination was performed in my office.  The patient was reexamined.  There have been no changes to this history and physical examination.  Brian Delgado A

## 2019-11-28 NOTE — Discharge Instructions (Signed)
AMBULATORY SURGERY  DISCHARGE INSTRUCTIONS   1) The drugs that you were given will stay in your system until tomorrow so for the next 24 hours you should not:  A) Drive an automobile B) Make any legal decisions C) Drink any alcoholic beverage   2) You may resume regular meals tomorrow.  Today it is better to start with liquids and gradually work up to solid foods.  You may eat anything you prefer, but it is better to start with liquids, then soup and crackers, and gradually work up to solid foods.   3) Please notify your doctor immediately if you have any unusual bleeding, trouble breathing, redness and pain at the surgery site, drainage, fever, or pain not relieved by medication.    4) Additional Instructions:        Please contact your physician with any problems or Same Day Surgery at [phone], Monday through Friday 6 am to 4 pm, or [place] at [facility] number at [phone].[facility]  POST OPERATIVE INSTRUCTIONS FOR DR. TROXLER, DR. Ether Griffins, AND [facility] P[facility]   1. Take your medication as prescribed.  Pain medication should be taken only as needed.  2. Keep the dressing clean, dry and intact.  3. Keep your foot elevated above the heart level for the first 48 hours.  4. We have instructed you to be non-weight bearing.  5. Always wear your post-op shoe when walking.  Always use your crutches if you are to be non-weight bearing.  6. Do not take a shower. Baths are permissible as long as the foot is kept out of the water.   7. Every hour you are awake:  - Bend your knee 15 times.  8. Call [facility] [phone]) if any of the following problems occur: - You develop a temperature or fever. - The bandage becomes saturated with blood. - Medication does not stop your pain. - Injury of the foot occurs. - Any symptoms of infection including redness, odor, or red streaks  running from wound.

## 2019-11-28 NOTE — Op Note (Signed)
Operative note   Surgeon:Shoua Ressler Armed forces logistics/support/administrative officer: None    Preop diagnosis: 1.  Distal fibular fracture right ankle 2.  Deltoid ligament tear right medial ankle    Postop diagnosis: Same    Procedure: 1.  ORIF distal fibular fracture right ankle 2.  Open repair deltoid ligament right medial ankle    EBL: Minimal    Anesthesia:regional and general.  Patient underwent popliteal block in the preop holding area.  Supplemented with 20 cc of 0.25% bupivacaine along the incision sites    Hemostasis: Thigh tourniquet inflated to 250 mmHg for approximately 1 hour.    Specimen: None    Complications: None    Operative indications:Brian Delgado is an 34 y.o. that presents today for surgical intervention.  The risks/benefits/alternatives/complications have been discussed and consent has been given.    Procedure:  Patient was brought into the OR and placed on the operating table in thesupine position. After anesthesia was obtained theright lower extremity was prepped and draped in usual sterile fashion.  Attention was directed to the lateral aspect of the right ankle where a longitudinal incision was performed.  Sharp and blunt dissection was carried down to the periosteum.  Subperiosteal dissection was then performed.  The short oblique distal fracture was noted.  This was displaced.  All soft tissue was removed from the fracture site.  This was then reduced with a bone compression clamp.  A lateral plate was placed.  2 holes distal and 4 holes proximal to the fracture site were filled with 3.5 mm locking screws.  Anatomic alignment was noted in all planes.    Attention was then directed to the medial aspect of the ankle along the deltoid region.  An incision was performed.  Sharp and blunt dissection was carried down to the deep fibers of the deltoid and the ankle joint itself.  The deltoid fibers and ligament was noted to be within the medial gutter.  This was removed from the medial gutter.  At  this time with a 2-0 PDS this was primarily repaired.  The more superficial fibers were repaired with a 3-0 Vicryl.  Anatomic alignment was noted with fluoroscopy in all planes.  At this time closure was then performed with a combination of 2-0 and 3-0 Vicryl for the subcutaneous and deeper tissues and skin staples for the skin.  A bulky sterile dressing was applied.  Patient was then placed at neutral 90 degree angle into a equalizer walker boot.    Patient tolerated the procedure and anesthesia well.  Was transported from the OR to the PACU with all vital signs stable and vascular status intact. To be discharged per routine protocol.  Will follow up in approximately 1 week in the outpatient clinic.  A prescription for Percocet was sent to his pharmacy today.

## 2019-12-01 ENCOUNTER — Encounter: Payer: Self-pay | Admitting: Podiatry

## 2019-12-01 NOTE — Anesthesia Postprocedure Evaluation (Signed)
Anesthesia Post Note  Patient: Brian Delgado  Procedure(s) Performed: OPEN REDUCTION INTERNAL FIXATION (ORIF) ANKLE FRACTURE (Right Ankle)  Patient location during evaluation: PACU Anesthesia Type: General Level of consciousness: awake and alert Pain management: pain level controlled Vital Signs Assessment: post-procedure vital signs reviewed and stable Respiratory status: spontaneous breathing, nonlabored ventilation, respiratory function stable and patient connected to nasal cannula oxygen Cardiovascular status: blood pressure returned to baseline and stable Postop Assessment: no apparent nausea or vomiting Anesthetic complications: no   No complications documented.   Last Vitals:  Vitals:   11/28/19 1538 11/28/19 1600  BP: (!) 147/89 (!) 143/97  Pulse: 74 84  Resp: 15 16  Temp: 36.9 C (!) 36.2 C  SpO2: 99% 99%    Last Pain:  Vitals:   11/28/19 1600  TempSrc: Tympanic  PainSc: 0-No pain                 Lenard Simmer

## 2019-12-22 ENCOUNTER — Ambulatory Visit: Payer: Self-pay | Attending: Internal Medicine

## 2019-12-22 DIAGNOSIS — Z23 Encounter for immunization: Secondary | ICD-10-CM

## 2019-12-22 NOTE — Progress Notes (Signed)
   Covid-19 Vaccination Clinic  Name:  Brian Delgado    MRN: [id number] DOB: September 30, 1985  12/22/2019  [name] was observed post Covid-19 immunization for 15 minutes without incident. He was provided with Vaccine Information Sheet and instruction to access the V-Safe system.   [name] was instructed to call 911 with any severe reactions post vaccine: Marland Kitchen Difficulty breathing  . Swelling of face and throat  . A fast heartbeat  . A bad rash all over body  . Dizziness and weakness   Immunizations Administered    Name Date Dose VIS Date Route   JANSSEN COVID-19 VACCINE 12/22/2019  3:21 PM 0.5 mL 11/05/2019 Intramuscular   Manufacturer: [employer]   Lot: [id number]   NDC: [phone]

## 2020-04-16 ENCOUNTER — Other Ambulatory Visit: Payer: Self-pay

## 2020-04-16 ENCOUNTER — Ambulatory Visit
Admission: EM | Admit: 2020-04-16 | Discharge: 2020-04-16 | Disposition: A | Discharge status: Home / Self Care | Payer: BC Managed Care – PPO | Attending: Sports Medicine | Admitting: Sports Medicine

## 2020-04-16 DIAGNOSIS — J029 Acute pharyngitis, unspecified: Secondary | ICD-10-CM | POA: Diagnosis not present

## 2020-04-16 DIAGNOSIS — K120 Recurrent oral aphthae: Secondary | ICD-10-CM | POA: Insufficient documentation

## 2020-04-16 LAB — GROUP A STREP BY PCR: Group A Strep by PCR: NOT DETECTED

## 2020-04-16 MED ORDER — LIDOCAINE VISCOUS HCL 2 % MT SOLN: 15.0000 mL | Freq: Four times a day (QID) | OROMUCOSAL | 0 refills | Status: AC | PRN

## 2020-04-16 NOTE — ED Provider Notes (Signed)
[facility]    CSN: [id number] Arrival date & time: 04/16/20  0801      History   Chief Complaint Chief Complaint  Patient presents with  . Sore Throat    HPI Brian Delgado is a 35 y.o. male.   35 year old male who presents for evaluation of the above issues.  He works over [employer] and was sent here from work due to his sore throat.  Patient reports having a sore throat now for 3 days.  He says it is progressively getting worse.  He has been using over-the-counter meds and honey and hot drinks but it does not seem to be helping.  He denies any strep exposure.  He has been vaccinated against COVID but no booster.  No COVID history or Covid exposure.  He has not received his flu shot.  No fever shakes chills.  No nausea vomiting diarrhea.  Has no significant past medical history and takes no medications on a regular basis.  He denies any chest pain or shortness of breath.  He does have a little bit of mild ear discomfort.  No red flag signs or symptoms elicited on history.     Past Medical History:  Diagnosis Date  . GERD (gastroesophageal reflux disease)   . Headache   . No known health problems     There are no problems to display for this patient.   Past Surgical History:  Procedure Laterality Date  . KNEE SURGERY Right   . ORIF ANKLE FRACTURE Right 11/28/2019   Procedure: OPEN REDUCTION INTERNAL FIXATION (ORIF) ANKLE FRACTURE;  Surgeon: [name], DPM;  Location: [facility] ORS;  Service: Podiatry;  Laterality: Right;  . TONSILLECTOMY         Home Medications    Prior to Admission medications   Medication Sig Start Date End Date Taking? Authorizing Provider  lidocaine (XYLOCAINE) 2 % solution Use as directed 15 mLs in the mouth or throat every 6 (six) hours as needed for mouth pain. 04/16/20  Yes Delton See, MD  acetaminophen (TYLENOL) 500 MG tablet Take 1,000 mg by mouth every 6 (six) hours as needed for moderate pain.    [provider]   ibuprofen (ADVIL) 200 MG tablet Take 400-600 mg by mouth every 6 (six) hours as needed for moderate pain.    [provider]  oxyCODONE-acetaminophen (PERCOCET) 5-325 MG tablet Take 1 tablet by mouth every 4 (four) hours as needed for severe pain. 11/28/19 11/27/20  [name], DPM    Family History Family History  Problem Relation Age of Onset  . Healthy Mother   . Healthy Father     Social History Social History   Tobacco Use  . Smoking status: Former Smoker    Quit date: 01/26/2005    Years since quitting: 15.2  . Smokeless tobacco: Never Used  Vaping Use  . Vaping Use: Never used  Substance Use Topics  . Alcohol use: Yes    Alcohol/week: 3.0 standard drinks    Types: 3 Cans of beer per week  . Drug use: No     Allergies   Patient has no known allergies.   Review of Systems Review of Systems  Constitutional: Negative for activity change, appetite change, chills, diaphoresis, fatigue and fever.  HENT: Positive for ear pain and sore throat. Negative for congestion, ear discharge, postnasal drip, rhinorrhea, sinus pressure, sinus pain, sneezing and tinnitus.   Eyes: Negative.  Negative for pain.  Respiratory: Negative.  Negative for cough  and chest tightness.   Cardiovascular: Negative.  Negative for chest pain and palpitations.  Gastrointestinal: Negative.  Negative for abdominal pain, constipation, diarrhea, nausea and vomiting.  Genitourinary: Negative.  Negative for dysuria.  Musculoskeletal: Negative.  Negative for back pain and myalgias.  Skin: Negative.  Negative for rash.  Neurological: Negative.  Negative for dizziness, tremors, syncope, light-headedness, numbness and headaches.  All other systems reviewed and are negative.    Physical Exam Triage Vital Signs ED Triage Vitals  Enc Vitals Group     BP 04/16/20 0809 (!) 158/112     Pulse Rate 04/16/20 0809 93     Resp 04/16/20 0809 16     Temp 04/16/20 0809 97.7 F (36.5 C)     Temp  Source 04/16/20 0809 Oral     SpO2 04/16/20 0809 99 %     Weight 04/16/20 0810 150 lb (68 kg)     Height 04/16/20 0810 5\' 8"  (1.727 m)     Head Circumference --      Peak Flow --      Pain Score 04/16/20 0810 7     Pain Loc --      Pain Edu? --      Excl. in GC? --    No data found.  Updated Vital Signs BP (!) 158/112   Pulse 93   Temp 97.7 F (36.5 C) (Oral)   Resp 16   Ht 5\' 8"  (1.727 m)   Wt 68 kg   SpO2 99%   BMI 22.81 kg/m   Visual Acuity Right Eye Distance:   Left Eye Distance:   Bilateral Distance:    Right Eye Near:   Left Eye Near:    Bilateral Near:     Physical Exam Vitals and nursing note reviewed.  Constitutional:      General: He is not in acute distress.    Appearance: He is well-developed. He is not ill-appearing, toxic-appearing or diaphoretic.  HENT:     Head: Normocephalic and atraumatic.     Right Ear: External ear normal. There is impacted cerumen.     Left Ear: Tympanic membrane normal.     Mouth/Throat:     Mouth: Mucous membranes are moist. Oral lesions present.     Pharynx: Uvula midline. Posterior oropharyngeal erythema present. No pharyngeal swelling, oropharyngeal exudate or uvula swelling.     Tonsils: No tonsillar exudate or tonsillar abscesses. 0 on the right. 0 on the left.  Eyes:     Extraocular Movements:     Right eye: Normal extraocular motion.     Left eye: Normal extraocular motion.     Conjunctiva/sclera: Conjunctivae normal.     Pupils: Pupils are equal, round, and reactive to light.  Cardiovascular:     Rate and Rhythm: Normal rate and regular rhythm.     Heart sounds: Normal heart sounds. No murmur heard. No friction rub. No gallop.   Pulmonary:     Effort: Pulmonary effort is normal. No respiratory distress.     Breath sounds: Normal breath sounds. No stridor. No wheezing, rhonchi or rales.  Abdominal:     Palpations: Abdomen is soft.  Musculoskeletal:     Cervical back: Normal range of motion and neck supple.   Lymphadenopathy:     Cervical: No cervical adenopathy.  Skin:    General: Skin is warm and dry.     Capillary Refill: Capillary refill takes less than 2 seconds.     Findings: No erythema.  Neurological:  General: No focal deficit present.     Mental Status: He is alert and oriented to person, place, and time.      UC Treatments / Results  Labs (all labs ordered are listed, but only abnormal results are displayed) Labs Reviewed  GROUP A STREP BY PCR    EKG   Radiology No results found.  Procedures Procedures (including critical care time)  Medications Ordered in UC Medications - No data to display  Initial Impression / Assessment and Plan / UC Course  I have reviewed the triage vital signs and the nursing notes.  Pertinent labs & imaging results that were available during my care of the patient were reviewed by me and considered in my medical decision making (see chart for details).   Clinical impression: Sore throat x3 days.  Patient has some ulcerations on the uvula.  There is no exudate.  Mild erythema.  No other significant symptoms.  Examination and vital signs are reassuring.  Examination is consistent with aphthous ulcerations with several localized shallow round oval ulcers on the uvula.  Treatment plan: 1.  The findings and treatment plan were discussed in detail with the patient.  Patient was in agreement. 2.  Recommended we get a group A strep PCR.  It was negative. 3.  Educational handouts provided. 4.  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 5.  Prescribe viscous lidocaine. 6.  If symptoms persist then he should follow-up with his primary care provider or ENT.  He is also welcome to come back here if his symptoms change in any way.  He voiced verbal understanding. 7.  Follow-up here as needed.    Final Clinical Impressions(s) / UC Diagnoses   Final diagnoses:  Aphthous ulceration  Sore throat     Discharge  Instructions     You have canker sores.  I provided educational handout for you.  The treatment is as follows:  General measures: ?Oral hygiene - It is important to maintain good dental hygiene while at the same time avoiding trauma. A soft toothbrush, waxed tape-style dental floss, and a soft-tipped gum stimulator to gently remove plaque are generally well tolerated. A non-alcohol-containing mouthwash is often less irritating, but still effective, in decreasing microbial overgrowth. Toothpaste containing sodium lauryl sulfate (SLS) may exacerbate RAS in some patients. A trial of using SLS-free toothpaste could be considered. Less aggressive, more frequent professional dental cleaning is advised. ?Avoidance of exacerbating factors - Where possible, reduce traumatic factors inside the mouth (eg, sharp/rough dental restorations, braces). Avoid habits that cause trauma (eg, biting cheeks or lips) and foods that seem to exacerbate the process. ?Pain control - Topical anesthetics and coating agents can provide temporary relief of discomfort if used prior to eating and performing dental hygiene: .2% viscous lidocaine: may be applied directly to surface of ulcers or used as a swish and spit    ED Prescriptions    Medication Sig Dispense Auth. Provider   lidocaine (XYLOCAINE) 2 % solution Use as directed 15 mLs in the mouth or throat every 6 (six) hours as needed for mouth pain. 100 mL Delton See, MD     PDMP not reviewed this encounter.   Delton See, MD 04/16/20 [phone]

## 2020-04-16 NOTE — Discharge Instructions (Addendum)
You have canker sores.  I provided educational handout for you.  The treatment is as follows:  General measures: ?Oral hygiene - It is important to maintain good dental hygiene while at the same time avoiding trauma. A soft toothbrush, waxed tape-style dental floss, and a soft-tipped gum stimulator to gently remove plaque are generally well tolerated. A non-alcohol-containing mouthwash is often less irritating, but still effective, in decreasing microbial overgrowth. Toothpaste containing sodium lauryl sulfate (SLS) may exacerbate RAS in some patients. A trial of using SLS-free toothpaste could be considered. Less aggressive, more frequent professional dental cleaning is advised. ?Avoidance of exacerbating factors - Where possible, reduce traumatic factors inside the mouth (eg, sharp/rough dental restorations, braces). Avoid habits that cause trauma (eg, biting cheeks or lips) and foods that seem to exacerbate the process. ?Pain control - Topical anesthetics and coating agents can provide temporary relief of discomfort if used prior to eating and performing dental hygiene: 2% viscous lidocaine: may be applied directly to surface of ulcers or used as a swish and spit

## 2020-04-16 NOTE — ED Triage Notes (Signed)
Pt reports having sore throat x3 days. Painful when swallowing. Tried using otc meds without relief.

## 2021-10-05 IMAGING — XA DG ANKLE 2V *R*
5 series · 5 of 5 positions shown · non-contrast
Comparison: Right ankle x-rays dated November 19, 2019.

CLINICAL DATA: Right ankle fracture ORIF.

EXAM:
RIGHT ANKLE - 2 VIEW; DG C-ARM 1-60 MIN

[Series 1: cont. · 1 of 1 slices shown (1 of 5)]
[im 1/1]
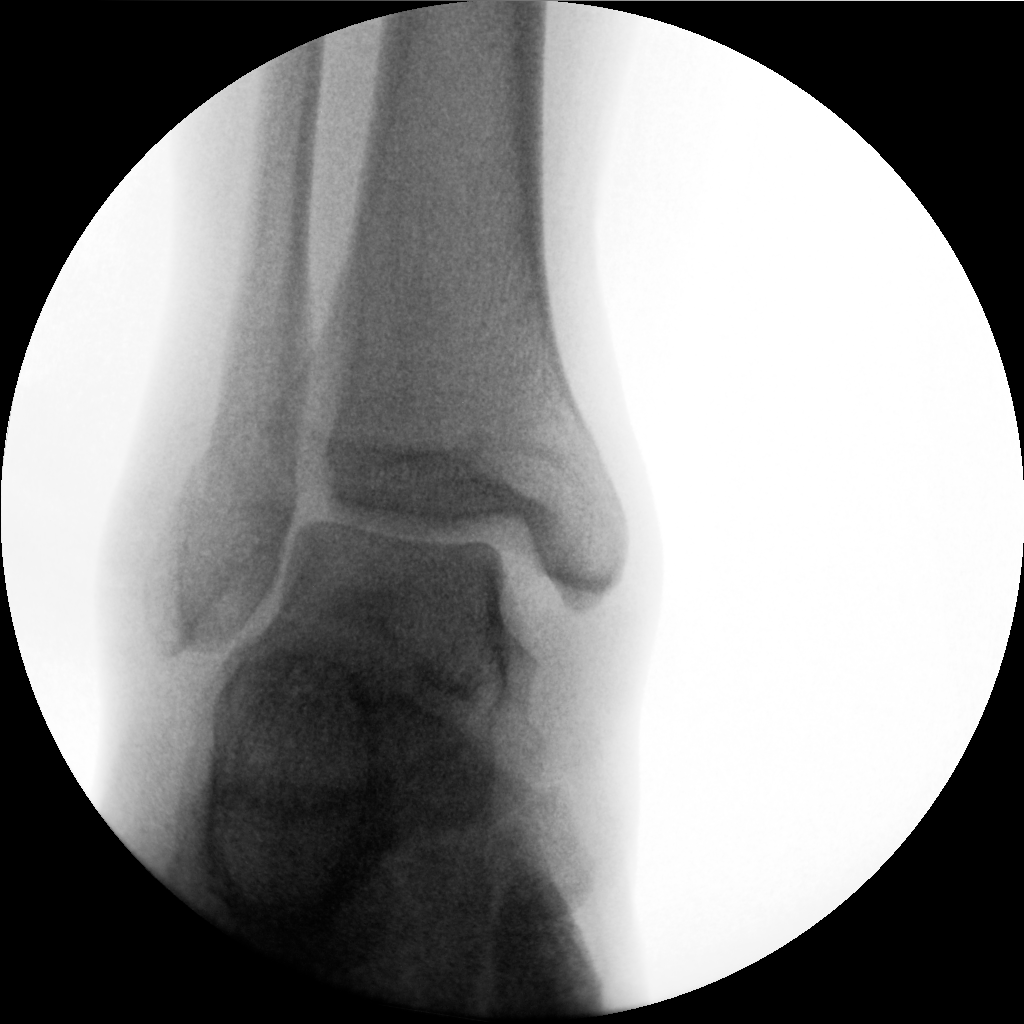

[Series 2: cont. · 1 of 1 slices shown (2 of 5)]
[im 1/1]
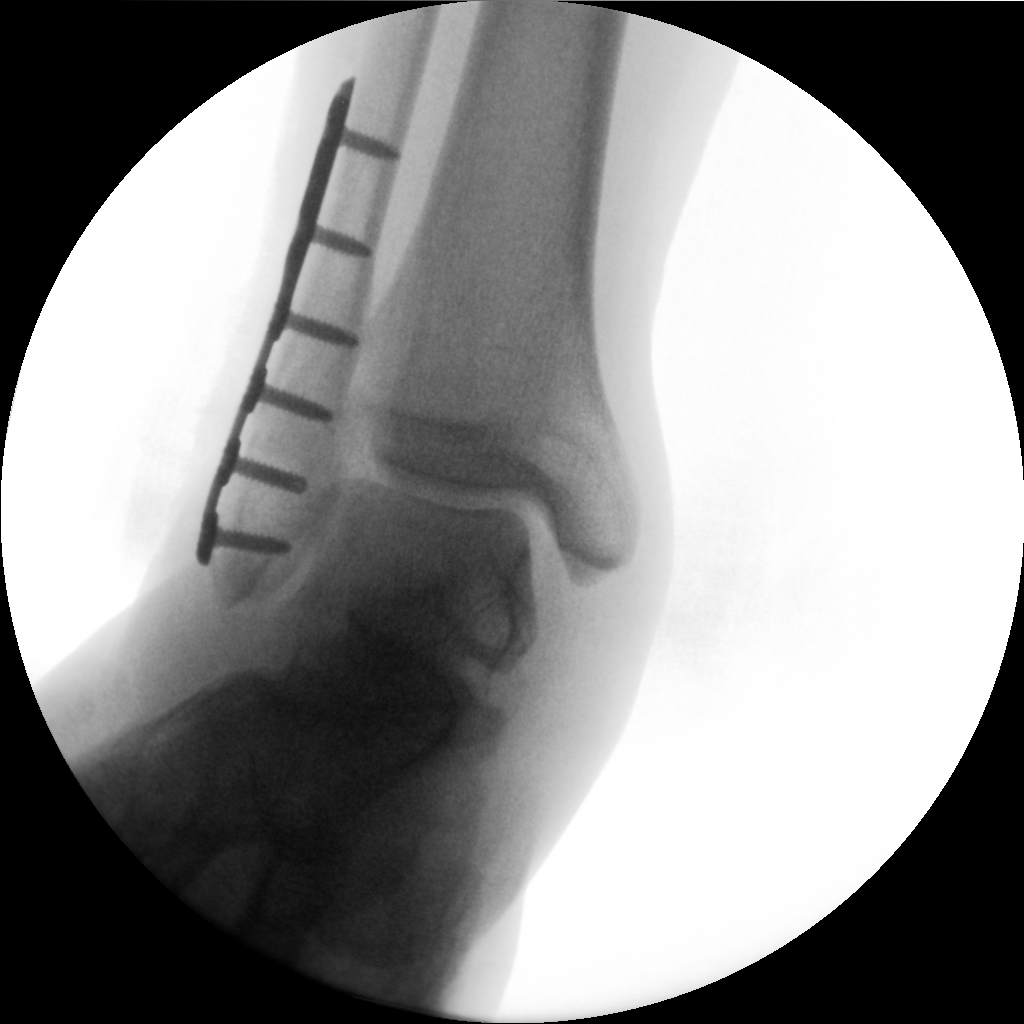

[Series 3: cont. · 1 of 1 slices shown (3 of 5)]
[im 1/1]
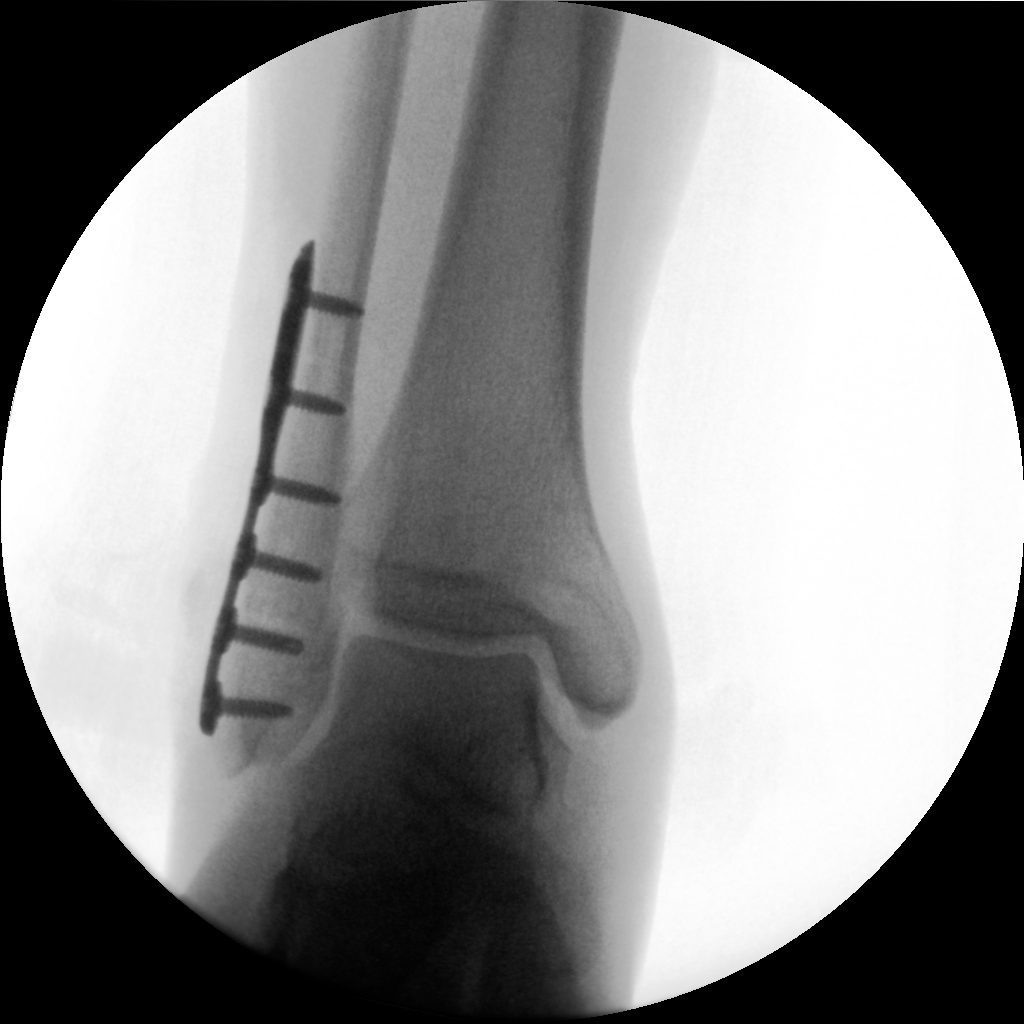

[Series 4: cont. · 1 of 1 slices shown (4 of 5)]
[im 1/1]
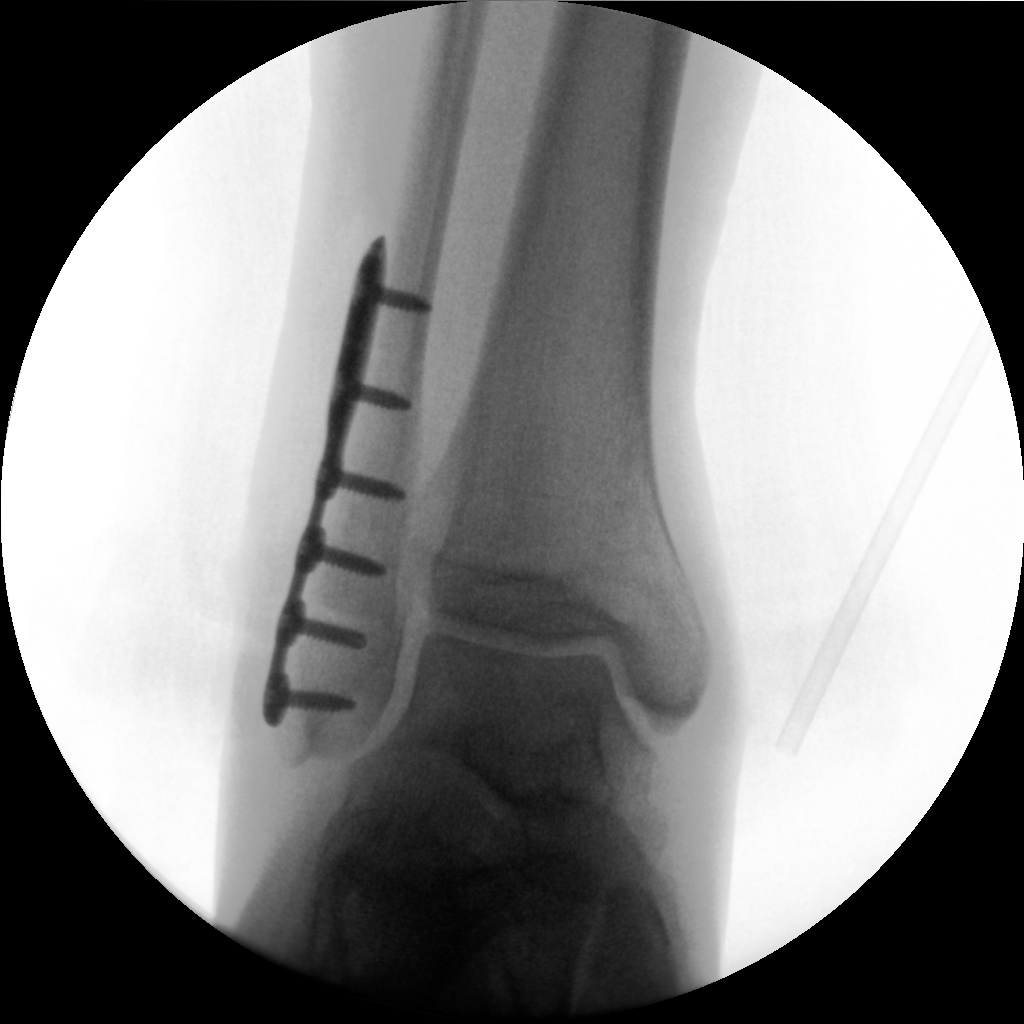

[Series 5: cont. · 1 of 1 slices shown (5 of 5)]
[im 1/1]
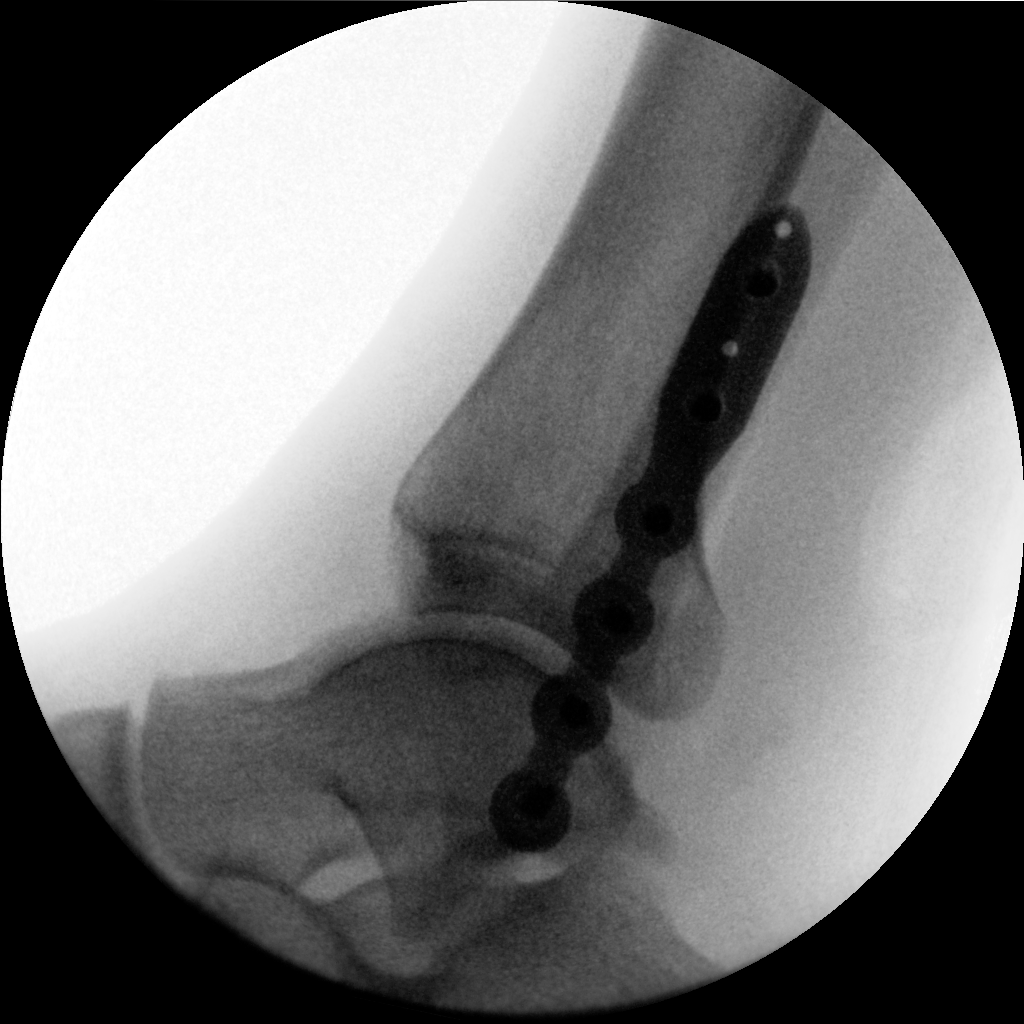

[5 of 5 positions shown; findings below may reference images not displayed]

FLUOROSCOPY TIME:  24 seconds.

C-arm fluoroscopic images were obtained intraoperatively and
submitted for post operative interpretation.
FINDINGS: Multiple intraoperative fluoroscopic images demonstrate interval
lateral plate and screw fixation of the distal fibular metaphyseal
fracture. No acute osseous abnormality.
IMPRESSION: Intraoperative fluoroscopic guidance for right distal fibula ORIF.

## 2021-12-23 ENCOUNTER — Ambulatory Visit
Admission: EM | Admit: 2021-12-23 | Discharge: 2021-12-23 | Disposition: A | Discharge status: Home / Self Care | Payer: BC Managed Care – PPO | Attending: Physician Assistant | Admitting: Physician Assistant

## 2021-12-23 DIAGNOSIS — B354 Tinea corporis: Secondary | ICD-10-CM | POA: Diagnosis present

## 2021-12-23 DIAGNOSIS — Z113 Encounter for screening for infections with a predominantly sexual mode of transmission: Secondary | ICD-10-CM | POA: Insufficient documentation

## 2021-12-23 MED ORDER — CLOTRIMAZOLE 1 % EX CREA: TOPICAL_CREAM | CUTANEOUS | 0 refills | Status: AC

## 2021-12-23 NOTE — ED Triage Notes (Signed)
Pt asks for STD testing to be done.  Pt states that he has been paranoid and believes his partner was cheating.   Pt states that he feels a "ting" sometimes but denies any penile discharge or pain.  Pt states that he had ringworm on his finger and thigh.

## 2021-12-23 NOTE — Discharge Instructions (Signed)
-  I have very low suspicion for STIs based on your exam today.  You do have body ringworm.  I have sent a cream to the pharmacy.  See the handout as well. - The results of your STI testing will come through MyChart in the next day or 2.  We will call you with any positive results.  Make sure to practice safe sex in future.

## 2021-12-23 NOTE — ED Provider Notes (Signed)
[facility]    CSN: [id number] Arrival date & time: 12/23/21  1550      History   Chief Complaint Chief Complaint  Patient presents with   Exposure to STD    HPI MATTY Delgado is a 36 y.o. male presenting for STI screening.  He reports that he thinks his current male partner is potentially cheating on him with someone but is not sure.  He denies her having any symptoms and he does not think he really has any symptoms either.  He says that he occasionally feels a "ting" when urinating.  He denies any painful urination, frequency, urgency, urethral discharge, testicular pain, swelling, genital lesions or rashes.  He reports a rash on his left thigh and left hand over the past several days which he says looks like ringworm.  He has not treated it in any way.  He reports about 10 sexual partners in his lifetime, all women.  He does not use protection with his current partner.  He has never had STI testing.  No other concerns.  HPI  Past Medical History:  Diagnosis Date   GERD (gastroesophageal reflux disease)    Headache    No known health problems     There are no problems to display for this patient.   Past Surgical History:  Procedure Laterality Date   KNEE SURGERY Right    ORIF ANKLE FRACTURE Right 11/28/2019   Procedure: OPEN REDUCTION INTERNAL FIXATION (ORIF) ANKLE FRACTURE;  Surgeon: Samara Deist, DPM;  Location: [facility] ORS;  Service: Podiatry;  Laterality: Right;   TONSILLECTOMY         Home Medications    Prior to Admission medications   Medication Sig Start Date End Date Taking? Authorizing Provider  acetaminophen (TYLENOL) 500 MG tablet Take 1,000 mg by mouth every 6 (six) hours as needed for moderate pain.   Yes [provider]  clotrimazole (LOTRIMIN) 1 % cream Apply to affected area 2 times daily 12/23/21  Yes Laurene Footman B, PA-C  ibuprofen (ADVIL) 200 MG tablet Take 400-600 mg by mouth every 6 (six) hours as needed for moderate pain.    Yes [provider]  lidocaine (XYLOCAINE) 2 % solution Use as directed 15 mLs in the mouth or throat every 6 (six) hours as needed for mouth pain. 04/16/20  Yes Verda Cumins, MD    Family History Family History  Problem Relation Age of Onset   Healthy Mother    Healthy Father     Social History Social History   Tobacco Use   Smoking status: Former    Types: Cigarettes    Quit date: 01/26/2005    Years since quitting: 16.9   Smokeless tobacco: Never  Vaping Use   Vaping Use: Never used  Substance Use Topics   Alcohol use: Yes    Alcohol/week: 3.0 standard drinks of alcohol    Types: 3 Cans of beer per week   Drug use: No     Allergies   Patient has no known allergies.   Review of Systems Review of Systems  Constitutional:  Negative for fatigue and fever.  Gastrointestinal:  Negative for abdominal pain, nausea and vomiting.  Genitourinary:  Negative for dysuria, frequency, genital sores, hematuria, penile discharge, penile pain, penile swelling, scrotal swelling, testicular pain and urgency.  Musculoskeletal:  Negative for arthralgias.  Skin:  Positive for rash.  Neurological:  Negative for weakness.     Physical Exam Triage Vital Signs ED Triage Vitals  Enc Vitals Group     BP 12/23/21 1625 (!) 176/111     Pulse Rate 12/23/21 1625 89     Resp 12/23/21 1625 18     Temp 12/23/21 1625 97.6 F (36.4 C)     Temp Source 12/23/21 1625 Oral     SpO2 12/23/21 1625 99 %     Weight 12/23/21 1624 160 lb (72.6 kg)     Height 12/23/21 1624 5\' 8"  (1.727 m)     Head Circumference --      Peak Flow --      Pain Score 12/23/21 1624 0     Pain Loc --      Pain Edu? --      Excl. in GC? --    No data found.  Updated Vital Signs BP (!) 176/111 (BP Location: Left Arm)   Pulse 89   Temp 97.6 F (36.4 C) (Oral)   Resp 18   Ht 5\' 8"  (1.727 m)   Wt 160 lb (72.6 kg)   SpO2 99%   BMI 24.33 kg/m   Physical Exam Vitals and nursing note reviewed.   Constitutional:      General: He is not in acute distress.    Appearance: Normal appearance. He is well-developed. He is not ill-appearing.  HENT:     Head: Normocephalic and atraumatic.  Eyes:     General: No scleral icterus.    Conjunctiva/sclera: Conjunctivae normal.  Cardiovascular:     Rate and Rhythm: Normal rate and regular rhythm.     Heart sounds: Normal heart sounds.  Pulmonary:     Effort: Pulmonary effort is normal. No respiratory distress.     Breath sounds: Normal breath sounds.  Abdominal:     Palpations: Abdomen is soft.     Tenderness: There is no abdominal tenderness.  Musculoskeletal:     Cervical back: Neck supple.  Skin:    General: Skin is warm and dry.     Capillary Refill: Capillary refill takes less than 2 seconds.     Findings: Rash present.     Comments: Small erythematous circular flat lesion of the left hand on left anterior thigh.  Both areas of rash have central clearing consistent with tinea corporis.  Neurological:     General: No focal deficit present.     Mental Status: He is alert. Mental status is at baseline.     Motor: No weakness.     Gait: Gait normal.  Psychiatric:        Mood and Affect: Mood is anxious.      UC Treatments / Results  Labs (all labs ordered are listed, but only abnormal results are displayed) Labs Reviewed  HIV ANTIBODY (ROUTINE TESTING W REFLEX)  RPR  CYTOLOGY, (ORAL, ANAL, URETHRAL) ANCILLARY ONLY    EKG   Radiology No results found.  Procedures Procedures (including critical care time)  Medications Ordered in UC Medications - No data to display  Initial Impression / Assessment and Plan / UC Course  I have reviewed the triage vital signs and the nursing notes.  Pertinent labs & imaging results that were available during my care of the patient were reviewed by me and considered in my medical decision making (see chart for details).   36 year old male presents for body ringworm rash of left thigh  and left hand for the past few days.  Also requesting STI testing.  Believes that his current sexual partner who is male possibly could have cheated on  him.  He has had 10 sexual partners in his lifetime and never been STI tested.  He does not have any STI symptoms.  He is anxious.  On exam he has 2 areas of rash consistent with body ringworm.  Patient performs urethral self swab for GC/chlamydia.  Blood testing obtained for syphilis and HIV since he has never had STI screening in the past.  Advised to access all results.  Will treat his tinea infection with clotrimazole.  Advised of good hygiene.  Advised of access to his STI screening results.  Advised safe sex and protection.  Follow-up as needed.   Final Clinical Impressions(s) / UC Diagnoses   Final diagnoses:  Tinea corporis  Screen for STD (sexually transmitted disease)     Discharge Instructions      -I have very low suspicion for STIs based on your exam today.  You do have body ringworm.  I have sent a cream to the pharmacy.  See the handout as well. - The results of your STI testing will come through [facility] in the next day or 2.  We will call you with any positive results.  Make sure to practice safe sex in future.   ED Prescriptions     Medication Sig Dispense Auth. Provider   clotrimazole (LOTRIMIN) 1 % cream Apply to affected area 2 times daily 15 g Danton Clap, PA-C      PDMP not reviewed this encounter.   Danton Clap, PA-C 12/23/21 1708

## 2021-12-24 LAB — RPR: RPR Ser Ql: NONREACTIVE

## 2021-12-24 LAB — HIV ANTIBODY (ROUTINE TESTING W REFLEX): HIV Screen 4th Generation wRfx: NONREACTIVE

## 2021-12-27 LAB — CYTOLOGY, (ORAL, ANAL, URETHRAL) ANCILLARY ONLY
Chlamydia: NEGATIVE
Comment: NEGATIVE
Comment: NEGATIVE
Comment: NORMAL
Neisseria Gonorrhea: NEGATIVE
Trichomonas: NEGATIVE
